# Patient Record
Sex: Male | Born: 1965 | Race: White | Hispanic: No | Smoking: Never smoker
Health system: Southern US, Academic
[De-identification: ages and names within clinical notes are randomized; demographics above are authoritative.]

## PROBLEM LIST (undated history)

## (undated) DIAGNOSIS — M25562 Pain in left knee: Secondary | ICD-10-CM

## (undated) DIAGNOSIS — Z86718 Personal history of other venous thrombosis and embolism: Secondary | ICD-10-CM

## (undated) DIAGNOSIS — E782 Mixed hyperlipidemia: Secondary | ICD-10-CM

## (undated) HISTORY — PX: KNEE ARTHROSCOPY: SUR90

## (undated) HISTORY — DX: Pain in left knee: M25.562

## (undated) HISTORY — DX: Mixed hyperlipidemia: E78.2

## (undated) HISTORY — DX: Personal history of other venous thrombosis and embolism: Z86.718

---

## 2010-12-17 ENCOUNTER — Other Ambulatory Visit (HOSPITAL_COMMUNITY): Payer: Self-pay | Admitting: Psychiatry

## 2021-12-06 ENCOUNTER — Other Ambulatory Visit (HOSPITAL_PSYCHIATRIC): Payer: Self-pay | Admitting: Psychiatry

## 2021-12-06 MED ORDER — METHYLPHENIDATE 10 MG TABLET
10.0000 mg | ORAL_TABLET | Freq: Three times a day (TID) | ORAL | 0 refills | Status: DC
Start: 2021-12-06 — End: 2022-01-04

## 2022-01-04 ENCOUNTER — Other Ambulatory Visit (HOSPITAL_PSYCHIATRIC): Payer: Self-pay | Admitting: Psychiatry

## 2022-01-04 MED ORDER — METHYLPHENIDATE 10 MG TABLET
10.0000 mg | ORAL_TABLET | Freq: Three times a day (TID) | ORAL | 0 refills | Status: DC
Start: 2022-01-04 — End: 2022-02-03

## 2022-01-12 ENCOUNTER — Encounter (HOSPITAL_PSYCHIATRIC): Payer: Self-pay | Admitting: Psychiatry

## 2022-01-12 DIAGNOSIS — F411 Generalized anxiety disorder: Secondary | ICD-10-CM | POA: Insufficient documentation

## 2022-01-12 DIAGNOSIS — F332 Major depressive disorder, recurrent severe without psychotic features: Secondary | ICD-10-CM

## 2022-01-12 NOTE — Progress Notes (Signed)
Behavioral Health-AMB    Last seen 08/09/2021, no med changes made then. He came to see his brother for thanksgiving.  No plans for Christmas.  Wife is doing good.  She has no complaints.  He states he has days where he has a tough time but manages fairly well.  Some bothersome thoughts, no suicidal ideation. His cognitive issues are "doing better". Takes meds, no SE. no recent reports of problems.  He has come off of hydrocodone. He has not been sleeping as well and is working on sleep hygiene.  No suicidal ideation.  No psychosis.  He looks forward to football season. His wife is supportive but concerned.  He denies any psychosis.  He states that he has a lot of bothersome and worrisome thoughts which take to resolve.  It also affects his ability to remember information because he is fretful.  He has had no psychosis, no homicidal ideation, no substance use issues. He takes medications.  Sleep is fair.  Appetite good.    PHQ9 = 15    Board of Pharmacy:  looks appropriate    Diagnosis:  AXIS I:  MDD, recurrent, severe without psychosis  GAD    AXISII:  None    AXISIII:  L. Knee pain  Anorgasmia    Current Medications from provider:  Valium  5mg  po three times daily as needed.  He has rarely been taking Valium  Neurontin 400 mg tid  Prozac 60 mg qam  Wellbutrin SR 300 qam, 150 mg q afternoon  Viagra 100 mg qday prn  Ritalin 10mg  twice a day  Ambien 10 mg p.o. nightly.    Allergies: NKDA    Pharmacy: Gets 90 day supply of medications [facility], he gets Ritalin at [facility] pharmacy at times, [facility]    Assessment & Plan    (1) Major depressive disorder, recurrent severe without psychotic features:        Status: Chronic        Plan:  Moderate level of medical decision making includes review of old records, discussion of multiple diagnosis and symptoms, review of PHQ-9, review of symptoms, patient education, discussion of prescribed medications and potential side effects,  discussion of psychosocial stressors, and review of prescription monitoring program information.  I offered support and encouragement.     From a psychiatric standpoint he is doing fairly well.  He is found a good routine that helps him maintain stability and whether depressive episodes.  Offered support.  No changes in medications.  I commended his efforts to come off of opiate medications. He will call with any problems and has crisis number should he require them.I offered support and encouraged him to call if he had any problems.  We have discussed medications and warnings associated with stimulants and benzodiazepines many times in the past.  He will call with any problems.          Code(s):  F33.2 - Major depressive disorder, recurrent severe without psychotic features  (2) Generalized anxiety disorder:        Status: Chronic        Code(s):  F41.1 - Generalized anxiety disorder

## 2022-02-03 ENCOUNTER — Other Ambulatory Visit: Payer: Self-pay

## 2022-02-03 ENCOUNTER — Ambulatory Visit: Payer: Medicare Other | Attending: Psychiatry | Admitting: Psychiatry

## 2022-02-03 ENCOUNTER — Encounter (HOSPITAL_PSYCHIATRIC): Payer: Self-pay | Admitting: Psychiatry

## 2022-02-03 VITALS — BP 147/86 | HR 86 | Resp 18 | Ht 71.0 in | Wt 253.0 lb

## 2022-02-03 DIAGNOSIS — F332 Major depressive disorder, recurrent severe without psychotic features: Secondary | ICD-10-CM | POA: Insufficient documentation

## 2022-02-03 DIAGNOSIS — F411 Generalized anxiety disorder: Secondary | ICD-10-CM | POA: Insufficient documentation

## 2022-02-03 MED ORDER — GABAPENTIN 400 MG CAPSULE
400.0000 mg | ORAL_CAPSULE | Freq: Three times a day (TID) | ORAL | 1 refills | Status: DC
Start: 2022-02-03 — End: 2022-02-25

## 2022-02-03 MED ORDER — METHYLPHENIDATE 10 MG TABLET
10.0000 mg | ORAL_TABLET | Freq: Three times a day (TID) | ORAL | 0 refills | Status: DC
Start: 2022-02-03 — End: 2022-02-05

## 2022-02-03 MED ORDER — VORTIOXETINE 10 MG TABLET
10.0000 mg | ORAL_TABLET | Freq: Every day | ORAL | 1 refills | Status: DC
Start: 2022-02-03 — End: 2022-04-26

## 2022-02-03 NOTE — Progress Notes (Signed)
[facility]  BEHAVIORAL MEDICINE, [facility] [place]  Operated by [facility]  Progress Note    Name: Preston Jones MRN:  [id number]   Date: 02/03/2022 Age: 56 y.o.       Chief Complaint:  Seen for follow-up of severe depression and anxiety    Subjective:   Last seen 08/09/2021, no med changes made then. He and the wife are up in [place] for the summer.  He still deals with social anxiety.  No plans for summer.  Wife is doing good. She has no complaints.  He states he has days where he has a tough time but manages fairly well.  Some bothersome thoughts, no suicidal ideation. His cognitive issues are "doing okay". Takes meds, no SE. no recent reports of problems.  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  He has come off of hydrocodone. He has not been sleeping as well and is working on sleep hygiene.  No suicidal ideation.  No psychosis. He takes medications.  Sleep is fair.  Appetite good.  He states that he tried to stop Prozac but had to go back on the medication because worsening depressive symptoms.  He continues to have issues with it would like to change to a different medication.    PHQ9 = 11    Board of Pharmacy:  looks appropriate    Diagnosis:  AXIS I:  MDD, recurrent, severe without psychosis  GAD    AXISII:  None    AXISIII:  L. Knee pain  Anorgasmia    Current Medications from provider:  Valium  5mg  po three times daily as needed.  He has rarely been taking Valium  Neurontin 400 mg tid  Prozac 60 mg qam  Wellbutrin SR 300 qam, 150 mg q afternoon  Viagra 100 mg qday prn  Ritalin 10mg  twice a day  Ambien 10 mg p.o. nightly.    Allergies: NKDA    Pharmacy: Gets 90 day supply of medications [facility], he gets Ritalin at [facility] pharmacy at times, [facility]    Review of Systems:     All systems reviewed & are unremarkable except as noted in HPI and below  Constitutional:  alert and oriented x4  and appears well  HENT: normal HENT inspection and hearing grossly normal bilaterally  Respiratory: normal respiratory effort, No respiratory distress   Cardiovascular:  No cardiac complaints, no chest pain  Gastrointestinal:  No GI complaints  Musculoskeletal:  No complaints  Skin:  Warm,  dry, no rashes  Neurological:  No focal neurological deficits  Objective :  The patient is alert and oriented x4, casually dressed, good eye contact, well groomed, appearing stated age.  Speech is normal rate and tone.  Patient is talkative and personable. There is no flight of ideas, loosening of associations, or tangential speech.  Not manic.  Mood is euthymic with no complaints.  Affect congruent.  Patient does not appear to be in any acute physical distress.  No suicidal or homicidal ideation.  No auditory or visual hallucinations, no delusions, no paranoia.  No signs of psychosis.  No plans to harm self or others.  Patient is not aggressive or threatening.  No psychomotor agitation.  No psychomotor retardation.  No abnormal involuntary movements. Thoughts are linear, logical, and goal directed.  Intellectual functioning is good.  Memory is intact to recent, remote, and past events.  Patient can recall 3 of  3 objects at 0 and 5 minutes, and what was eaten for last meal.  Patient able to provide details of current situation.  Patient can name the president, vice president, and governor.  Language is good.  Vocabulary is unimpaired, no word finding difficulty or word misuse.  Intelligence is good, patient can interpret a proverb, and reports apple and orange similarity.  Calculation is unimpaired.  Concentration is good, able to recite days of week forward and backward.  Insight is good; patient is aware of their illness, how it affects their functioning, and what needs to happen for future improvement.  Judgment is good; patient is compliant with treatment and can relate appropriately to what they would do if smelling smoke in  a theater, or finding stamped addressed envelope.      Data reviewed:  PHQ-9, board of pharmacy profile, old records    Current Outpatient Medications   Medication Sig   . buPROPion (WELLBUTRIN SR) 150 mg Oral tablet sustained-release 12 hr Take 1 Tablet (150 mg total) by mouth Twice daily Take 300 mg po qam,  po q2pm.   . Cholecalciferol, Vitamin D3, 25 mcg (1,000 unit) Oral Capsule Take 1 Capsule (1,000 Units total) by mouth Once a day   . diazePAM (VALIUM) 5 mg Oral Tablet Take 1 Tablet (5 mg total) by mouth Three times a day as needed for Anxiety   . diclofenac sodium (ARTHRITIS PAIN, DICLOFENAC,) 1 % Gel Apply 2 g topically Four times a day   . fenofibrate (LOFIBRA) 160 mg Oral Tablet Take 1 Tablet (160 mg total) by mouth Once a day   . FLUoxetine (PROZAC) 20 mg Oral Capsule Take 3 Capsules (60 mg total) by mouth Every morning   . gabapentin (NEURONTIN) 400 mg Oral Capsule Take 1 Capsule (400 mg total) by mouth Three times a day   . hydrocortisone 2.5 % Apply externally cream with perineal applicator Insert 1 Applicator into the rectum Twice per day as needed   . Ibuprofen (MOTRIN) 800 mg Oral Tablet Take 1 Tablet (800 mg total) by mouth Three times a day   . methylphenidate HCl (RITALIN) 10 mg Oral Tablet Take 1 Tablet (10 mg total) by mouth Three times a day   . pravastatin (PRAVACHOL) 40 mg Oral Tablet Take 1 Tablet (40 mg total) by mouth Once a day   . zolpidem (AMBIEN) 10 mg Oral Tablet Take 1 Tablet (10 mg total) by mouth Every night as needed for Insomnia     Assessment/Plan  Problem List Items Addressed This Visit        Psychiatric    Major depressive disorder, recurrent, severe without psychotic features (CMS HCC) - Primary    Generalized anxiety disorder     Plan:  Moderate level of medical decision making includes review of old records, discussion of multiple diagnosis and symptoms, review of PHQ-9, review of symptoms, patient education, discussion of prescribed medications and potential side  effects, discussion of psychosocial stressors, and review of prescription monitoring program information.  I offered support and encouragement.     From a psychiatric standpoint he is doing fairly well.  The patient has side effects from Prozac are intolerable.  We will taper and stop that medication over time.  I will start him on Trintellix as well.  We will do a crossover titration that I gave him a list of on paper.  Continue other medications.  He will get refills today at a local pharmacy for 30 day supply with  2 refills on Neurontin, Trintellix, and a 1 month supply of Ritalin.    we discussed side effects specifically nausea, I Offered support.  No changes in other medications.  He will call with any problems and has crisis number should he require them.I offered support and encouraged him to call if he had any problems.  We have discussed medications and warnings associated with stimulants and benzodiazepines many times in the past.  He will call with any problems.      Vickki Hearing, MD

## 2022-02-05 ENCOUNTER — Other Ambulatory Visit (HOSPITAL_PSYCHIATRIC): Payer: Self-pay | Admitting: Psychiatry

## 2022-02-05 MED ORDER — METHYLPHENIDATE 10 MG TABLET
10.0000 mg | ORAL_TABLET | Freq: Three times a day (TID) | ORAL | 0 refills | Status: DC
Start: 2022-02-05 — End: 2022-02-25

## 2022-02-06 ENCOUNTER — Other Ambulatory Visit (HOSPITAL_PSYCHIATRIC): Payer: Self-pay | Admitting: Psychiatry

## 2022-02-17 ENCOUNTER — Other Ambulatory Visit (HOSPITAL_PSYCHIATRIC): Payer: Self-pay | Admitting: Psychiatry

## 2022-02-25 ENCOUNTER — Other Ambulatory Visit (HOSPITAL_PSYCHIATRIC): Payer: Self-pay | Admitting: Family

## 2022-02-25 MED ORDER — METHYLPHENIDATE 10 MG TABLET
10.0000 mg | ORAL_TABLET | Freq: Three times a day (TID) | ORAL | 0 refills | Status: DC
Start: 2022-03-07 — End: 2022-03-09

## 2022-02-25 MED ORDER — GABAPENTIN 400 MG CAPSULE
400.0000 mg | ORAL_CAPSULE | Freq: Three times a day (TID) | ORAL | 1 refills | Status: DC
Start: 2022-02-25 — End: 2022-04-26

## 2022-02-25 NOTE — Telephone Encounter (Signed)
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 He may be asking for a few days early because he gets it from mail order. Thanks!

## 2022-02-25 NOTE — Telephone Encounter (Signed)
I changed the date on the prescription of Ritalin. Thanks!

## 2022-02-25 NOTE — Telephone Encounter (Signed)
RX approved and encounter closed, PDMP reviewed thus fill date for Ritalin as stated. This is mail order so will allow time for delivery.

## 2022-03-09 ENCOUNTER — Other Ambulatory Visit (HOSPITAL_PSYCHIATRIC): Payer: Self-pay | Admitting: Psychiatry

## 2022-03-09 ENCOUNTER — Telehealth (HOSPITAL_PSYCHIATRIC): Payer: Self-pay | Admitting: Psychiatry

## 2022-03-10 MED ORDER — METHYLPHENIDATE 10 MG TABLET
10.0000 mg | ORAL_TABLET | Freq: Three times a day (TID) | ORAL | 0 refills | Status: DC
Start: 2022-03-10 — End: 2022-04-04

## 2022-03-22 ENCOUNTER — Other Ambulatory Visit (HOSPITAL_PSYCHIATRIC): Payer: Self-pay | Admitting: Psychiatry

## 2022-03-24 ENCOUNTER — Ambulatory Visit (HOSPITAL_PSYCHIATRIC): Payer: BC Managed Care – PPO | Admitting: Psychiatry

## 2022-03-24 NOTE — Progress Notes (Deleted)
[facility]  BEHAVIORAL MEDICINE, [facility] [place]  Operated by [facility]  Progress Note    Name: Preston Jones MRN:  [id number]   Date: 03/24/2022 Age: 56 y.o.       Chief Complaint:  Seen for follow-up of severe depression and anxiety    Subjective:   Last seen 02/03/2022,no med changes made then. He and the wife are up in [place] for the summer.  He still deals with social anxiety. No plans for summer. Wife is doing good. She has no complaints. He states he has days where he has a tough time but manages fairly well. Some bothersome thoughts, no suicidal ideation. His cognitive issues are "doing okay". Takes meds, no SE. no recent reports of problems. 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  He has come off of hydrocodone. He has not been sleeping as well and is working on sleep hygiene. No suicidal ideation. No psychosis. He takes medications. Sleep is fair. Appetite good.  He states that he tried to stop Prozac but had to go back on the medication because worsening depressive symptoms.  He continues to have issues with it would like to change to a different medication.    PHQ9 = 11    Board of Pharmacy:  looks appropriate    Diagnosis:  AXIS I:  MDD, recurrent, severe without psychosis  GAD    AXISII:  None    AXISIII:  L. Knee pain  Anorgasmia    Current Medications from provider:  Valium 5mg  po three times daily as needed. He has rarely been taking Valium  Neurontin 400 mg tid  Prozac 60 mg qam  Wellbutrin SR 300 qam, 150 mg q afternoon  Viagra 100 mg qday prn  Ritalin 10mg  twice a day  Ambien 10 mg p.o. nightly.    Allergies: NKDA    Pharmacy: Gets [facility] day supply of medications [facility], he gets Ritalin at [facility] pharmacy at times, [facility]    Review of Systems:           All systems reviewed & are unremarkable except as noted in HPI and below  Constitutional:  alert and  oriented x4 and appears well  HENT: normal HENT inspection and hearing grossly normal bilaterally  Respiratory: normal respiratory effort, No respiratory distress   Cardiovascular:  No cardiac complaints, no chest pain  Gastrointestinal:  No GI complaints  Musculoskeletal:  No complaints  Skin:  Warm,  dry, no rashes  Neurological:  No focal neurological deficits  Objective :  The patient is alert and oriented x4, casually dressed, good eye contact, well groomed, appearing stated age.  Speech is normal rate and tone.  Patient is talkative and personable. There is no flight of ideas, loosening of associations, or tangential speech.  Not manic.  Mood is euthymic with no complaints.  Affect congruent.  Patient does not appear to be in any acute physical distress.  No suicidal or homicidal ideation.  No auditory or visual hallucinations, no delusions, no paranoia.  No signs of psychosis.  No plans to harm self or others.  Patient is not aggressive or threatening.  No psychomotor agitation.  No psychomotor retardation.  No abnormal involuntary movements. Thoughts are linear, logical, and goal directed.  Intellectual functioning is good.  Memory is intact to recent, remote, and past events.  Patient can recall 3 of 3 objects at 0 and 5  minutes, and what was eaten for last meal.  Patient able to provide details of current situation.  Patient can name the president, vice president, and governor.  Language is good.  Vocabulary is unimpaired, no word finding difficulty or word misuse.  Intelligence is good, patient can interpret a proverb, and reports apple and orange similarity.  Calculation is unimpaired.  Concentration is good, able to recite days of week forward and backward.  Insight is good; patient is aware of their illness, how it affects their functioning, and what needs to happen for future improvement.  Judgment is good; patient is compliant with treatment and can relate appropriately to what they would do if  smelling smoke in a theater, or finding stamped addressed envelope.      Data reviewed:  PHQ-9, board of pharmacy profile, old records    Current Outpatient Medications   Medication Sig   . buPROPion (WELLBUTRIN SR) 150 mg Oral tablet sustained-release 12 hr Take 1 Tablet (150 mg total) by mouth Twice daily Take 300 mg po qam, 150mg  po q2pm.   . Cholecalciferol, Vitamin D3, 25 mcg (1,000 unit) Oral Capsule Take 1 Capsule (1,000 Units total) by mouth Once a day   . diazePAM (VALIUM) 5 mg Oral Tablet Take 1 Tablet (5 mg total) by mouth Three times a day as needed for Anxiety   . diclofenac sodium (VOLTAREN) 1 % Gel Apply 2 g topically Four times a day   . fenofibrate (LOFIBRA) 160 mg Oral Tablet Take 1 Tablet (160 mg total) by mouth Once a day   . FLUoxetine (PROZAC) 20 mg Oral Capsule TAKE 3 CAPSULES (60 MG) EVERY MORNING   . gabapentin (NEURONTIN) 400 mg Oral Capsule Take 1 Capsule (400 mg total) by mouth Three times a day   . hydrocortisone 2.5 % Apply externally cream with perineal applicator Insert 1 Applicator into the rectum Twice per day as needed   . Ibuprofen (MOTRIN) 800 mg Oral Tablet Take 1 Tablet (800 mg total) by mouth Three times a day   . methylphenidate HCl (RITALIN) 10 mg Oral Tablet Take 1 Tablet (10 mg total) by mouth Three times a day   . pravastatin (PRAVACHOL) 40 mg Oral Tablet Take 1 Tablet (40 mg total) by mouth Once a day   . vortioxetine (TRINTELLIX) 10 mg Oral Tablet Take 1 Tablet (10 mg total) by mouth Once a day   . zolpidem (AMBIEN) 10 mg Oral Tablet Take 1 Tablet (10 mg total) by mouth Every night as needed for Insomnia     Assessment/Plan  Problem List Items Addressed This Visit        Psychiatric    Major depressive disorder, recurrent, severe without psychotic features (CMS HCC)    Generalized anxiety disorder - Primary     Plan:  Moderate level of medical decision making includes review of old records, discussion of multiple diagnosis and symptoms, review of PHQ-9, review of  symptoms, patient education, discussion of prescribed medications and potential side effects, discussion of psychosocial stressors, and review of prescription monitoring program information. I offered support and encouragement.     From a psychiatric standpoint he is doing fairly well.  The patient has side effects from Prozac are intolerable.  We will taper and stop that medication over time.  I will start him on Trintellix as well.  We will do a crossover titration that I gave him a list of on paper.  Continue other medications.  He will get refills today at  a local pharmacy for 30 day supply with 2 refills on Neurontin, Trintellix, and a 1 month supply of Ritalin.   we discussed side effects specifically nausea, I Offered support. No changes in other medications. He will call with any problems and has crisis number should he require them.I offered support and encouraged him to call if he had any problems. We have discussed medications and warnings associated with stimulants and benzodiazepines many times in the past. He will call with any problems.     Vickki Hearing, MD

## 2022-04-04 ENCOUNTER — Other Ambulatory Visit (HOSPITAL_PSYCHIATRIC): Payer: Self-pay | Admitting: Family

## 2022-04-04 MED ORDER — METHYLPHENIDATE 10 MG TABLET
10.0000 mg | ORAL_TABLET | Freq: Three times a day (TID) | ORAL | 0 refills | Status: DC
Start: 2022-04-04 — End: 2022-05-02

## 2022-04-04 NOTE — Telephone Encounter (Signed)
RX approved and encounter closed

## 2022-04-04 NOTE — Telephone Encounter (Signed)
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 Do you mind to [facility] [place]? Thanks!

## 2022-04-05 ENCOUNTER — Other Ambulatory Visit (HOSPITAL_PSYCHIATRIC): Payer: Self-pay | Admitting: Psychiatry

## 2022-04-26 ENCOUNTER — Ambulatory Visit: Payer: Medicare Other | Attending: Psychiatry | Admitting: Psychiatry

## 2022-04-26 ENCOUNTER — Other Ambulatory Visit: Payer: Self-pay

## 2022-04-26 ENCOUNTER — Encounter (HOSPITAL_PSYCHIATRIC): Payer: Self-pay | Admitting: Psychiatry

## 2022-04-26 VITALS — BP 156/84 | HR 76 | Resp 18 | Ht 71.0 in | Wt 250.0 lb

## 2022-04-26 DIAGNOSIS — F411 Generalized anxiety disorder: Secondary | ICD-10-CM | POA: Insufficient documentation

## 2022-04-26 DIAGNOSIS — F332 Major depressive disorder, recurrent severe without psychotic features: Secondary | ICD-10-CM | POA: Insufficient documentation

## 2022-04-26 MED ORDER — DIAZEPAM 5 MG TABLET
5.0000 mg | ORAL_TABLET | Freq: Three times a day (TID) | ORAL | 1 refills | Status: DC | PRN
Start: 2022-04-26 — End: 2022-07-04

## 2022-04-26 MED ORDER — ESCITALOPRAM 10 MG TABLET
10.0000 mg | ORAL_TABLET | Freq: Every day | ORAL | 1 refills | Status: DC
Start: 2022-04-26 — End: 2022-04-28

## 2022-04-26 MED ORDER — ZOLPIDEM 10 MG TABLET
10.0000 mg | ORAL_TABLET | Freq: Every evening | ORAL | 1 refills | Status: DC | PRN
Start: 2022-04-26 — End: 2022-07-04

## 2022-04-26 MED ORDER — GABAPENTIN 400 MG CAPSULE
400.0000 mg | ORAL_CAPSULE | Freq: Three times a day (TID) | ORAL | 1 refills | Status: DC
Start: 2022-04-26 — End: 2022-05-05

## 2022-04-26 MED ORDER — BUPROPION HCL SR 150 MG TABLET,12 HR SUSTAINED-RELEASE
ORAL_TABLET | ORAL | 1 refills | Status: DC
Start: 2022-04-26 — End: 2022-06-20

## 2022-04-26 NOTE — Progress Notes (Signed)
[facility]  BEHAVIORAL MEDICINE, [facility] [place]  Operated by [facility]  Progress Note    Name: Preston Jones MRN:  [id number]   Date: 04/26/2022 Age: 56 y.o.       Chief Complaint:  Seen for follow-up of severe depression and anxiety     Subjective:   Last seen 02/03/2022, no med changes made then. He and the wife are up in [place] for the summer.  He still deals with social anxiety. He tries to avoid people if he can.  No plans for rest of summer.  Trintellix was not covered by insurance.  Wife worries about him.  Some bothersome thoughts, no suicidal ideation. His cognitive issues are "doing okay". Takes meds, no SE. no recent reports of problems.  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  He has come off of hydrocodone. He has not been sleeping as well and is working on sleep hygiene.  No suicidal ideation.  No psychosis. He takes medications.  Sleep is fair.  Appetite good.  He states that he tried to stop Prozac but had to go back on the medication because worsening depressive symptoms.  He continues to have issues with it would like to change to a different medication.     PHQ9 = 18     Board of Pharmacy:  looks appropriate     Diagnosis:  AXIS I:  MDD, recurrent, severe without psychosis  GAD     AXISII:  None     AXISIII:  L. Knee pain  Anorgasmia     Current Medications from provider:  Valium  5mg  po three times daily as needed.  He has rarely been taking Valium  Neurontin 400 mg tid  Prozac 60 mg qam  Wellbutrin SR 300 qam, 150 mg q afternoon  Viagra 100 mg qday prn  Ritalin 10mg  twice a day  Ambien 10 mg p.o. nightly.     Allergies: NKDA     Pharmacy: [facility], [facility], [facility]     Review of Systems:           All systems reviewed & are unremarkable except as noted in HPI and below  Constitutional:  alert and oriented x4  and appears well  HENT: normal HENT inspection and hearing grossly normal bilaterally  Respiratory: normal respiratory effort, No respiratory distress   Cardiovascular:  No cardiac complaints, no chest pain  Gastrointestinal:  No GI complaints  Musculoskeletal:  No complaints  Skin:  Warm,  dry, no rashes  Neurological:  No focal neurological deficits  Objective :  The patient is alert and oriented x4, casually dressed, good eye contact, well groomed, appearing stated age.  Speech is normal rate and tone.  Patient is talkative and personable. There is no flight of ideas, loosening of associations, or tangential speech.  Not manic.  Mood is euthymic with no complaints.  Affect congruent.  Patient does not appear to be in any acute physical distress.  No suicidal or homicidal ideation.  No auditory or visual hallucinations, no delusions, no paranoia.  No signs of psychosis.  No plans to harm self or others.  Patient is not aggressive or threatening.  No psychomotor agitation.  No psychomotor retardation.  No abnormal involuntary movements. Thoughts are linear, logical, and goal directed.  Intellectual functioning is good.  Memory is  intact to recent, remote, and past events.  Patient can recall 3 of 3 objects at 0 and 5 minutes, and what was eaten for last meal.  Patient able to provide details of current situation.  Patient can name the president, vice president, and governor.  Language is good.  Vocabulary is unimpaired, no word finding difficulty or word misuse.  Intelligence is good, patient can interpret a proverb, and reports apple and orange similarity.  Calculation is unimpaired.  Concentration is good, able to recite days of week forward and backward.  Insight is good; patient is aware of their illness, how it affects their functioning, and what needs to happen for future improvement.  Judgment is good; patient is compliant with treatment and can relate appropriately to what they would do if smelling smoke in  a theater, or finding stamped addressed envelope.       Data reviewed:  PHQ-9, board of pharmacy profile, old records    Current Outpatient Medications   Medication Sig    buPROPion (WELLBUTRIN SR) 150 mg Oral tablet sustained-release 12 hr TAKE 2 TABLETS EVERY MORNING AND 1 TABLET EVERY 2 P.M. AS DIRECTED    Cholecalciferol, Vitamin D3, 25 mcg (1,000 unit) Oral Capsule Take 1 Capsule (1,000 Units total) by mouth Once a day    diazePAM (VALIUM) 5 mg Oral Tablet Take 1 Tablet (5 mg total) by mouth Three times a day as needed for Anxiety    diclofenac sodium (VOLTAREN) 1 % Gel Apply 2 g topically Four times a day    fenofibrate (LOFIBRA) 160 mg Oral Tablet Take 1 Tablet (160 mg total) by mouth Once a day    FLUoxetine (PROZAC) 20 mg Oral Capsule TAKE 3 CAPSULES (60 MG) EVERY MORNING    gabapentin (NEURONTIN) 400 mg Oral Capsule Take 1 Capsule (400 mg total) by mouth Three times a day    hydrocortisone 2.5 % Apply externally cream with perineal applicator Insert 1 Applicator into the rectum Twice per day as needed    Ibuprofen (MOTRIN) 800 mg Oral Tablet Take 1 Tablet (800 mg total) by mouth Three times a day    methylphenidate HCl (RITALIN) 10 mg Oral Tablet Take 1 Tablet (10 mg total) by mouth Three times a day    pravastatin (PRAVACHOL) 40 mg Oral Tablet Take 1 Tablet (40 mg total) by mouth Once a day    vortioxetine (TRINTELLIX) 10 mg Oral Tablet Take 1 Tablet (10 mg total) by mouth Once a day    zolpidem (AMBIEN) 10 mg Oral Tablet Take 1 Tablet (10 mg total) by mouth Every night as needed for Insomnia     Assessment/Plan  Problem List Items Addressed This Visit          Psychiatric    Major depressive disorder, recurrent, severe without psychotic features (CMS HCC) - Primary     Plan:  Moderate level of medical decision making includes review of old records, discussion of multiple diagnosis and symptoms, review of PHQ-9, review of symptoms, patient education, discussion of prescribed medications and potential side  effects, discussion of psychosocial stressors, and review of prescription monitoring program information.  I offered support and encouragement.      The patient continues to struggle with some depressive symptoms.  We will stop Prozac and replace it with Lexapro 10 mg p.o. q.a.m.Marland Kitchen  Continue other medications.  I think he has some apathy and anhedonia related to long-term Prozac administration.   I Offered support.  No changes in other medications.  He will call with any problems and has crisis number should he require them.I offered support and encouraged him to call if he had any problems.  We have discussed medications and warnings associated with stimulants and benzodiazepines many times in the past.  He will call with any problems.      Vickki Hearing, MD

## 2022-04-28 ENCOUNTER — Other Ambulatory Visit (HOSPITAL_PSYCHIATRIC): Payer: Self-pay | Admitting: Psychiatry

## 2022-04-28 MED ORDER — ESCITALOPRAM 10 MG TABLET
10.0000 mg | ORAL_TABLET | Freq: Every day | ORAL | 0 refills | Status: DC
Start: 2022-04-28 — End: 2022-07-04

## 2022-05-02 ENCOUNTER — Other Ambulatory Visit (HOSPITAL_PSYCHIATRIC): Payer: Self-pay | Admitting: Psychiatry

## 2022-05-03 MED ORDER — METHYLPHENIDATE 10 MG TABLET
10.0000 mg | ORAL_TABLET | Freq: Three times a day (TID) | ORAL | 0 refills | Status: DC
Start: 2022-05-03 — End: 2022-05-31

## 2022-05-05 ENCOUNTER — Other Ambulatory Visit (HOSPITAL_PSYCHIATRIC): Payer: Self-pay | Admitting: Psychiatry

## 2022-05-05 MED ORDER — GABAPENTIN 400 MG CAPSULE
400.0000 mg | ORAL_CAPSULE | Freq: Three times a day (TID) | ORAL | 1 refills | Status: DC
Start: 2022-05-05 — End: 2022-07-04

## 2022-05-05 NOTE — Telephone Encounter (Signed)
Patient called and wants his future refills of Gabapentin sent [facility] for [facility] day supply. I have built the prescription if you don't mind to send it in. [facility] [place]. Thanks!

## 2022-05-31 ENCOUNTER — Telehealth (HOSPITAL_PSYCHIATRIC): Payer: Self-pay | Admitting: Psychiatry

## 2022-05-31 ENCOUNTER — Other Ambulatory Visit (HOSPITAL_PSYCHIATRIC): Payer: Self-pay | Admitting: Psychiatry

## 2022-05-31 MED ORDER — METHYLPHENIDATE 10 MG TABLET
10.0000 mg | ORAL_TABLET | Freq: Three times a day (TID) | ORAL | 0 refills | Status: DC
Start: 2022-05-31 — End: 2022-06-29

## 2022-05-31 NOTE — Telephone Encounter (Signed)
[facility]ief [facility] [place]. Please call him back and let him know you got it called in.  [phone]

## 2022-06-19 ENCOUNTER — Other Ambulatory Visit (HOSPITAL_PSYCHIATRIC): Payer: Self-pay | Admitting: Psychiatry

## 2022-06-20 ENCOUNTER — Ambulatory Visit (HOSPITAL_PSYCHIATRIC): Payer: Self-pay | Admitting: Psychiatry

## 2022-06-29 ENCOUNTER — Other Ambulatory Visit (HOSPITAL_PSYCHIATRIC): Payer: Self-pay | Admitting: Psychiatry

## 2022-06-29 MED ORDER — METHYLPHENIDATE 10 MG TABLET
10.0000 mg | ORAL_TABLET | Freq: Three times a day (TID) | ORAL | 0 refills | Status: DC
Start: 2022-06-29 — End: 2022-07-04

## 2022-06-30 ENCOUNTER — Ambulatory Visit (HOSPITAL_PSYCHIATRIC): Payer: Medicare Other | Admitting: Psychiatry

## 2022-07-04 ENCOUNTER — Ambulatory Visit: Payer: Medicare Other | Attending: Psychiatry | Admitting: Psychiatry

## 2022-07-04 ENCOUNTER — Other Ambulatory Visit: Payer: Self-pay

## 2022-07-04 ENCOUNTER — Encounter (HOSPITAL_PSYCHIATRIC): Payer: Self-pay | Admitting: Psychiatry

## 2022-07-04 VITALS — BP 139/69 | HR 79 | Resp 18 | Ht 71.0 in | Wt 250.0 lb

## 2022-07-04 DIAGNOSIS — F332 Major depressive disorder, recurrent severe without psychotic features: Secondary | ICD-10-CM | POA: Insufficient documentation

## 2022-07-04 DIAGNOSIS — F411 Generalized anxiety disorder: Secondary | ICD-10-CM

## 2022-07-04 MED ORDER — BUPROPION HCL SR 150 MG TABLET,12 HR SUSTAINED-RELEASE
ORAL_TABLET | ORAL | 1 refills | Status: DC
Start: 2022-07-04 — End: 2023-01-25

## 2022-07-04 MED ORDER — GABAPENTIN 400 MG CAPSULE
400.0000 mg | ORAL_CAPSULE | Freq: Three times a day (TID) | ORAL | 1 refills | Status: DC
Start: 2022-07-04 — End: 2022-08-03

## 2022-07-04 MED ORDER — ZOLPIDEM 10 MG TABLET
10.0000 mg | ORAL_TABLET | Freq: Every evening | ORAL | 1 refills | Status: DC | PRN
Start: 2022-07-04 — End: 2022-08-03

## 2022-07-04 MED ORDER — DIAZEPAM 5 MG TABLET
5.0000 mg | ORAL_TABLET | Freq: Three times a day (TID) | ORAL | 1 refills | Status: DC | PRN
Start: 2022-07-04 — End: 2022-08-03

## 2022-07-04 MED ORDER — METHYLPHENIDATE 10 MG TABLET
10.0000 mg | ORAL_TABLET | Freq: Three times a day (TID) | ORAL | 0 refills | Status: DC
Start: 2022-07-04 — End: 2022-08-03

## 2022-07-04 MED ORDER — FLUOXETINE 20 MG CAPSULE
60.0000 mg | ORAL_CAPSULE | Freq: Every day | ORAL | 1 refills | Status: DC
Start: 2022-07-04 — End: 2022-08-03

## 2022-07-04 NOTE — Progress Notes (Signed)
[facility]  BEHAVIORAL MEDICINE, [facility] [place]  Operated by [facility]  Progress Note    Name: Preston Jones MRN:  [id number]   Date: 07/04/2022 Age: 56 y.o.       Chief Complaint:  Seen for follow-up of severe depression and anxiety     Subjective:   Last seen 04/26/2022, no med changes made then. He is doing fairly well.  He is had trouble finding a pharmacy that carries Ritalin.  He still deals with social anxiety. He tries to avoid people if he can.  No plans for fall.  He was unable to tolerate Lexapro and went back to Prozac with good results.  He had significant drop mouth.  Wife worries about him.  Some bothersome thoughts, no suicidal ideation. His cognitive issues are "doing okay". Takes meds, no SE. no recent reports of problems.  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  He has come off of hydrocodone. He has not been sleeping as well and is working on sleep hygiene.  No suicidal ideation.  No psychosis. He takes medications.  Sleep is fair.  Appetite good.  He states that he tried to stop Prozac but had to go back on the medication because worsening depressive symptoms.  He continues to have issues with it would like to change to a different medication.     PHQ9 = 18     Board of Pharmacy:  looks appropriate     Diagnosis:  AXIS I:  MDD, recurrent, severe without psychosis  GAD     AXISII:  None     AXISIII:  L. Knee pain  Anorgasmia     Current Medications from provider:  Valium  5mg  po three times daily as needed.  He has rarely been taking Valium  Neurontin 400 mg tid  Prozac 60 mg qam  Wellbutrin SR 300 qam, 150 mg q afternoon  Viagra 100 mg qday prn  Ritalin 10mg  twice a day  Ambien 10 mg p.o. nightly.     Allergies: NKDA     Pharmacy: [facility], he gets [facility]italin at [facility] pharmacy at times, [facility]     Review of Systems:           All systems  reviewed & are unremarkable except as noted in HPI and below  Constitutional:  alert and oriented x4 and appears well  HENT: normal HENT inspection and hearing grossly normal bilaterally  Respiratory: normal respiratory effort, No respiratory distress   Cardiovascular:  No cardiac complaints, no chest pain  Gastrointestinal:  No GI complaints  Musculoskeletal:  No complaints  Skin:  Warm,  dry, no rashes  Neurological:  No focal neurological deficits  Objective :  The patient is alert and oriented x4, casually dressed, good eye contact, well groomed, appearing stated age.  Speech is normal rate and tone.  Patient is talkative and personable. There is no flight of ideas, loosening of associations, or tangential speech.  Not manic.  Mood is euthymic with no complaints.  Affect congruent.  Patient does not appear to be in any acute physical distress.  No suicidal or homicidal ideation.  No auditory or visual hallucinations, no delusions, no paranoia.  No signs of psychosis.  No plans to harm self or others.  Patient is not aggressive or threatening.  No psychomotor agitation.  No psychomotor retardation.  No abnormal  involuntary movements. Thoughts are linear, logical, and goal directed.  Intellectual functioning is good.  Memory is intact to recent, remote, and past events.  Patient can recall 3 of 3 objects at 0 and 5 minutes, and what was eaten for last meal.  Patient able to provide details of current situation.  Patient can name the president, vice president, and governor.  Language is good.  Vocabulary is unimpaired, no word finding difficulty or word misuse.  Intelligence is good, patient can interpret a proverb, and reports apple and orange similarity.  Calculation is unimpaired.  Concentration is good, able to recite days of week forward and backward.  Insight is good; patient is aware of their illness, how it affects their functioning, and what needs to happen for future improvement.  Judgment is good;  patient is compliant with treatment and can relate appropriately to what they would do if smelling smoke in a theater, or finding stamped addressed envelope.       Data reviewed:  PHQ-9, board of pharmacy profile, old records    Current Outpatient Medications   Medication Sig    buPROPion (WELLBUTRIN SR) 150 mg Oral tablet sustained-release 12 hr TAKE 2 TABLETS EVERY MORNING AND 1 TABLET EVERY 2 P.M. AS DIRECTED    Cholecalciferol, Vitamin D3, 25 mcg (1,000 unit) Oral Capsule Take 1 Capsule (1,000 Units total) by mouth Once a day    diazePAM (VALIUM) 5 mg Oral Tablet Take 1 Tablet (5 mg total) by mouth Three times a day as needed for Anxiety    diclofenac sodium (VOLTAREN) 1 % Gel Apply 2 g topically Four times a day    escitalopram oxalate (LEXAPRO) 10 mg Oral Tablet Take 1 Tablet (10 mg total) by mouth Once a day    fenofibrate (LOFIBRA) 160 mg Oral Tablet Take 1 Tablet (160 mg total) by mouth Once a day    gabapentin (NEURONTIN) 400 mg Oral Capsule Take 1 Capsule (400 mg total) by mouth Three times a day    hydrocortisone 2.5 % Apply externally cream with perineal applicator Insert 1 Applicator into the rectum Twice per day as needed    Ibuprofen (MOTRIN) 800 mg Oral Tablet Take 1 Tablet (800 mg total) by mouth Three times a day    methylphenidate HCl (RITALIN) 10 mg Oral Tablet Take 1 Tablet (10 mg total) by mouth Three times a day    pravastatin (PRAVACHOL) 40 mg Oral Tablet Take 1 Tablet (40 mg total) by mouth Once a day    zolpidem (AMBIEN) 10 mg Oral Tablet Take 1 Tablet (10 mg total) by mouth Every night as needed for Insomnia     Assessment/Plan  Problem List Items Addressed This Visit          Psychiatric    Major depressive disorder, recurrent, severe without psychotic features (CMS HCC) - Primary    Generalized anxiety disorder     Plan:  Moderate level of medical decision making includes review of old records, discussion of multiple diagnosis and symptoms, review of PHQ-9, review of symptoms, patient  education, discussion of prescribed medications and potential side effects, discussion of psychosocial stressors, and review of prescription monitoring program information.  I offered support and encouragement.      Mr. Georgina Snell has complicated depression and anxiety which has historically has required polypharmacy and unorthodox doses of medications.  His complicated medicine regimen is effective and has developed over many years with numerous failures of medications.  I sent in refills of medications.  I Offered support.   He will call with any problems and has crisis number should he require them.I offered support and encouraged him to call if he had any problems.  We have discussed medications and warnings associated with stimulants and benzodiazepines many times in the past.  He will call with any problems.      Vickki Hearing, MD

## 2022-08-03 ENCOUNTER — Ambulatory Visit: Payer: Medicare Other | Attending: Family | Admitting: Family

## 2022-08-03 ENCOUNTER — Encounter (HOSPITAL_PSYCHIATRIC): Payer: Self-pay | Admitting: Family

## 2022-08-03 ENCOUNTER — Other Ambulatory Visit: Payer: Self-pay

## 2022-08-03 VITALS — BP 144/81 | HR 69 | Resp 18 | Ht 71.0 in | Wt 253.0 lb

## 2022-08-03 DIAGNOSIS — Z79899 Other long term (current) drug therapy: Secondary | ICD-10-CM | POA: Insufficient documentation

## 2022-08-03 DIAGNOSIS — F411 Generalized anxiety disorder: Secondary | ICD-10-CM | POA: Insufficient documentation

## 2022-08-03 DIAGNOSIS — F4010 Social phobia, unspecified: Secondary | ICD-10-CM | POA: Insufficient documentation

## 2022-08-03 DIAGNOSIS — G479 Sleep disorder, unspecified: Secondary | ICD-10-CM | POA: Insufficient documentation

## 2022-08-03 DIAGNOSIS — F331 Major depressive disorder, recurrent, moderate: Secondary | ICD-10-CM | POA: Insufficient documentation

## 2022-08-03 MED ORDER — GABAPENTIN 400 MG CAPSULE
400.0000 mg | ORAL_CAPSULE | Freq: Three times a day (TID) | ORAL | 1 refills | Status: DC
Start: 2022-08-03 — End: 2023-02-06

## 2022-08-03 MED ORDER — METHYLPHENIDATE 10 MG TABLET
10.0000 mg | ORAL_TABLET | Freq: Three times a day (TID) | ORAL | 0 refills | Status: DC
Start: 2022-08-03 — End: 2022-08-24

## 2022-08-03 MED ORDER — DIAZEPAM 5 MG TABLET
5.0000 mg | ORAL_TABLET | Freq: Three times a day (TID) | ORAL | 1 refills | Status: AC | PRN
Start: 2022-08-03 — End: ?

## 2022-08-03 MED ORDER — FLUOXETINE 20 MG CAPSULE
60.0000 mg | ORAL_CAPSULE | Freq: Every day | ORAL | 1 refills | Status: AC
Start: 2022-08-03 — End: ?

## 2022-08-03 MED ORDER — ZOLPIDEM 10 MG TABLET
10.0000 mg | ORAL_TABLET | Freq: Every evening | ORAL | 1 refills | Status: AC | PRN
Start: 2022-08-03 — End: ?

## 2022-08-03 NOTE — Progress Notes (Signed)
[facility]  BEHAVIORAL MEDICINE, [facility] [place]  Operated by [facility]  Progress Note    Name: Preston Jones MRN:  [id number]   Date: 08/03/2022 Age: 55 y.o.       Chief Complaint: Generalized Anxiety and Major Depression    Subjective:   Here for medication follow up. Was seeing Dr. Diona Browner who is no longer with practice. Takes valium 1-2 times a day but typically not after dinner, makes me sleepy. Feels helps anxiety.  Gabapentin helps with anxiety and pain.  Ritalin for focus and improved energy.   Ambien at bedtime for sleep. Relates he has weird dreams. Has been on so long can not sleep without it.     Prozac 60 mg a day for depression    Has trouble staying asleep, goes to lounge chair and sleeps again.   Feeling a little more comfortable having company at home, went to a ballgame and did alright.     Worked in [employer] for 22 years.   Does not like to engage with people socially as a rule, especially people he knew.     Planning to have meal with brothers tomorrow. Plan to have company for Christmas.    Objective :  BP (!) 144/81 (Site: Left, Patient Position: Sitting)   Pulse 69   Resp 18   Ht 1.803 m (5\' 11" )   Wt 115 kg (253 lb)   BMI 35.29 kg/m     Denies chest pain, palpitations, dizziness, syncope.     Alert and oriented x4.  Casual dress, calm, well groomed.  No SI /HI/AVH, delusions, or paranoia. Thoughts are logical, coherent, goal directed.  Good eye contact. Speech is normal rate and tone.  Mood is ``ok affect congruent.  No psychomotor agitation or psychomotor retardation, no cogwheel rigidity or abnormal movements.  Gait is normal.  Attention is good.  Concentration and memory good.  No cognitive deficits noted.  Judgment fair, insight fair.  Calculation and abstraction are within normal limits.      PDMP: Reviewed Valium filled 04/26/2022  Ritalin filled 07/04/2022  Gabapentin filled 05/17/2022  Ambien filled 04/26/2022    Data  reviewed:    Current Outpatient Medications   Medication Sig    buPROPion (WELLBUTRIN SR) 150 mg Oral tablet sustained-release 12 hr TAKE 2 TABLETS EVERY MORNING AND 1 TABLET EVERY 2 P.M. AS DIRECTED    Cholecalciferol, Vitamin D3, 25 mcg (1,000 unit) Oral Capsule Take 1 Capsule (1,000 Units total) by mouth Once a day    diazePAM (VALIUM) 5 mg Oral Tablet Take 1 Tablet (5 mg total) by mouth Three times a day as needed for Anxiety    diclofenac sodium (VOLTAREN) 1 % Gel Apply 2 g topically Four times a day    fenofibrate (LOFIBRA) 160 mg Oral Tablet Take 1 Tablet (160 mg total) by mouth Once a day    FLUoxetine (PROZAC) 20 mg Oral Capsule Take 3 Capsules (60 mg total) by mouth Once a day    gabapentin (NEURONTIN) 400 mg Oral Capsule Take 1 Capsule (400 mg total) by mouth Three times a day    hydrocortisone 2.5 % Apply externally cream with perineal applicator Insert 1 Applicator into the rectum Twice per day as needed    Ibuprofen (MOTRIN) 800 mg Oral Tablet Take 1 Tablet (800 mg total) by mouth Three times a day    methylphenidate HCl (RITALIN) 10 mg Oral Tablet Take 1 Tablet (10 mg  total) by mouth Three times a day    pravastatin (PRAVACHOL) 40 mg Oral Tablet Take 1 Tablet (40 mg total) by mouth Once a day    zolpidem (AMBIEN) 10 mg Oral Tablet Take 1 Tablet (10 mg total) by mouth Every night as needed for Insomnia     Assessment/Plan  Problem List Items Addressed This Visit          Psychiatric    Generalized anxiety disorder     Other Visit Diagnoses       Major depressive disorder, recurrent, moderate (CMS HCC)    -  Primary    Medication management        Relevant Orders    HELP LAB ORDER    Social anxiety disorder        Sleep disturbance              Moderate level of medical decision making, discussion of multiple diagnosis and symptoms, review of PHQ-9, review of symptoms, patient education, discussion of prescribed medications and potential side effects, discussion of psychosocial stressors, and review of  prescription monitoring program information.   Continue current medications.   Consider taking Ambien 1/2 tab with valium 1/2 tab for sleep.   Continue gabapentin 400 mg 3 times a day for pain and anxiety  Continue Methlphendidate 10 mg TID.   Follow up 3 months.     Candie Mile, PMHNP-BC  08/03/2022 14:01

## 2022-08-03 NOTE — Patient Instructions (Signed)
Continue current medications.   Consider taking Ambien 1/2 tab with valium 1/2 tab for sleep.   Continue gabapentin 400 mg 3 times a day for pain and anxiety  Continue Methlphendidate 10 mg TID.   Follow up 3 months.     Aware to avoid alcohol.

## 2022-08-08 NOTE — Addendum Note (Signed)
Addended by: Clide Deutscher A on: 08/08/2022 08:11 AM     Modules accepted: Orders

## 2022-08-11 LAB — DRUG MONITORING, PANEL 5, W/CONFIRM, D/L ISOMERS,URINE
Alphahydroxyalprazolam: NEGATIVE ng/mL (ref <25)
Alphahydroxymidazolam: NEGATIVE ng/mL (ref <50)
Alphahydroxytriazolam: NEGATIVE ng/mL (ref <50)
Aminoclonazepam: NEGATIVE ng/mL (ref <25)
Amphetamines: NEGATIVE ng/mL (ref <500)
Barbiturates: NEGATIVE ng/mL (ref <300)
Benzodiazepines: POSITIVE ng/mL — AB (ref <100)
Cocaine Metabolite: NEGATIVE ng/mL (ref <100)
Creatinine: 65.5 mg/dL (ref >=20.0)
Hydroxyethylflurazepam: NEGATIVE ng/mL (ref <50)
Lorazepam: NEGATIVE ng/mL (ref <50)
Marijuana Metabolite: NEGATIVE ng/mL (ref <20)
Methadone Metabolite: NEGATIVE ng/mL (ref <100)
Nordiazepam: 220 ng/mL — ABNORMAL HIGH (ref <50)
Opiates: NEGATIVE ng/mL (ref <100)
Oxazepam: 490 ng/mL — ABNORMAL HIGH (ref <50)
Oxidant: NEGATIVE ug/mL (ref <200)
Oxycodone: NEGATIVE ng/mL (ref <100)
Temazepam: 530 ng/mL — ABNORMAL HIGH (ref <50)
pH: 6.7 (ref 4.5–9.0)

## 2022-08-11 LAB — NOTES AND COMMENTS

## 2022-08-24 ENCOUNTER — Other Ambulatory Visit (HOSPITAL_PSYCHIATRIC): Payer: Self-pay | Admitting: Family

## 2022-08-24 MED ORDER — METHYLPHENIDATE 10 MG TABLET
10.0000 mg | ORAL_TABLET | Freq: Three times a day (TID) | ORAL | 0 refills | Status: DC
Start: 2022-08-30 — End: 2022-08-29

## 2022-08-24 NOTE — Telephone Encounter (Signed)
[facility] Sent with appropriate fill date.

## 2022-08-29 ENCOUNTER — Other Ambulatory Visit (HOSPITAL_COMMUNITY): Payer: Self-pay | Admitting: Family

## 2022-08-29 MED ORDER — METHYLPHENIDATE 10 MG TABLET
10.0000 mg | ORAL_TABLET | Freq: Three times a day (TID) | ORAL | 0 refills | Status: DC
Start: 2022-08-30 — End: 2022-09-29

## 2022-08-29 NOTE — Telephone Encounter (Signed)
Pt cancelled RX in [place] due to need to travel due to family emergency.

## 2022-09-29 ENCOUNTER — Other Ambulatory Visit (HOSPITAL_PSYCHIATRIC): Payer: Self-pay | Admitting: Family

## 2022-09-29 MED ORDER — METHYLPHENIDATE 10 MG TABLET
10.0000 mg | ORAL_TABLET | Freq: Three times a day (TID) | ORAL | 0 refills | Status: DC
Start: 2022-09-29 — End: 2022-10-26

## 2022-09-29 NOTE — Telephone Encounter (Signed)
[facility]

## 2022-10-26 ENCOUNTER — Other Ambulatory Visit (HOSPITAL_PSYCHIATRIC): Payer: Self-pay | Admitting: Family

## 2022-10-26 MED ORDER — METHYLPHENIDATE 10 MG TABLET
10.0000 mg | ORAL_TABLET | Freq: Three times a day (TID) | ORAL | 0 refills | Status: DC
Start: 2022-10-26 — End: 2022-11-28

## 2022-10-26 NOTE — Telephone Encounter (Signed)
Pt request refill [facility]ital[facility] to [facility] [place].

## 2022-11-03 ENCOUNTER — Ambulatory Visit: Payer: BC Managed Care – PPO | Admitting: Family

## 2022-11-28 ENCOUNTER — Other Ambulatory Visit (HOSPITAL_PSYCHIATRIC): Payer: Self-pay | Admitting: Family

## 2022-11-28 MED ORDER — METHYLPHENIDATE 10 MG TABLET
10.0000 mg | ORAL_TABLET | Freq: Three times a day (TID) | ORAL | 0 refills | Status: DC
Start: 2022-11-28 — End: 2022-12-23

## 2022-11-28 NOTE — Telephone Encounter (Signed)
Pt contacted provider requesting refill [facility]ital[facility] [place].

## 2022-12-19 ENCOUNTER — Ambulatory Visit (HOSPITAL_PSYCHIATRIC): Payer: Self-pay | Admitting: Family

## 2022-12-23 ENCOUNTER — Other Ambulatory Visit (HOSPITAL_PSYCHIATRIC): Payer: Self-pay | Admitting: Family

## 2022-12-23 MED ORDER — METHYLPHENIDATE 10 MG TABLET
10.0000 mg | ORAL_TABLET | Freq: Three times a day (TID) | ORAL | 0 refills | Status: DC
Start: 2022-12-23 — End: 2023-01-24

## 2022-12-23 NOTE — Telephone Encounter (Signed)
Pt requesting refill [facility]italin to [facility] [place]. Has pending appt Dr. Diona Browner.

## 2023-01-24 ENCOUNTER — Telehealth (HOSPITAL_COMMUNITY): Payer: Self-pay | Admitting: Family

## 2023-01-24 MED ORDER — METHYLPHENIDATE 10 MG TABLET
10.0000 mg | ORAL_TABLET | Freq: Three times a day (TID) | ORAL | 0 refills | Status: AC
Start: 2023-01-24 — End: ?

## 2023-01-24 NOTE — Telephone Encounter (Signed)
Patient request refill Ritalin, Has followup with Dr. Diona Browner on 02/11/2023. Patient located in [facility]. Reports no changes in his mental health.

## 2023-01-25 ENCOUNTER — Other Ambulatory Visit (HOSPITAL_PSYCHIATRIC): Payer: Self-pay | Admitting: Family

## 2023-01-25 MED ORDER — BUPROPION HCL SR 150 MG TABLET,12 HR SUSTAINED-RELEASE
ORAL_TABLET | ORAL | 0 refills | Status: AC
Start: 2023-01-25 — End: ?

## 2023-01-25 NOTE — Telephone Encounter (Signed)
[facility] requesting a 90 day prescription for bupropion. If you don't care to approve or deny. Next apt is 5/24. Thank you

## 2023-01-25 NOTE — Telephone Encounter (Signed)
RX approved and encounter closed

## 2023-02-03 ENCOUNTER — Ambulatory Visit: Payer: BC Managed Care – PPO | Admitting: Family

## 2023-02-06 ENCOUNTER — Other Ambulatory Visit (HOSPITAL_COMMUNITY): Payer: Self-pay | Admitting: Family

## 2023-02-06 MED ORDER — GABAPENTIN 400 MG CAPSULE
400.0000 mg | ORAL_CAPSULE | Freq: Three times a day (TID) | ORAL | 0 refills | Status: AC
Start: 2023-02-06 — End: ?

## 2023-02-06 NOTE — Telephone Encounter (Signed)
Pt request refill Neurontin. Has pending appointment with Dr. Diona Browner, long term established mental health provider.   Candie Mile, PMHNP-BC  02/06/2023 13:17

## 2023-05-16 ENCOUNTER — Other Ambulatory Visit (HOSPITAL_PSYCHIATRIC): Payer: Self-pay | Admitting: Family

## 2023-05-16 NOTE — Telephone Encounter (Signed)
Patient not seen by provider

## 2023-06-07 IMAGING — MR MRI KNEE RT W/O CONTRAST
5 series · 35 of 40 positions shown · IV contrast (gadolinium)
Comparison: None available.

﻿EXAM:  07081   MRI KNEE RT W/O CONTRAST
INDICATION: Patient felt popping sensation upon bending, last Monday. Persistent pain in the right knee over the medial aspect.  No history of previous surgery.
TECHNIQUE: Multiplanar, multisequential MRI of the right knee was performed without gadolinium contrast.

[Series 5: PD fat-sat · axial · right · 5.0mm · 0.33mm/px · z∈[-133,+4]mm · 8 of 26 slices shown (1 of 3)]
[im 1/26]
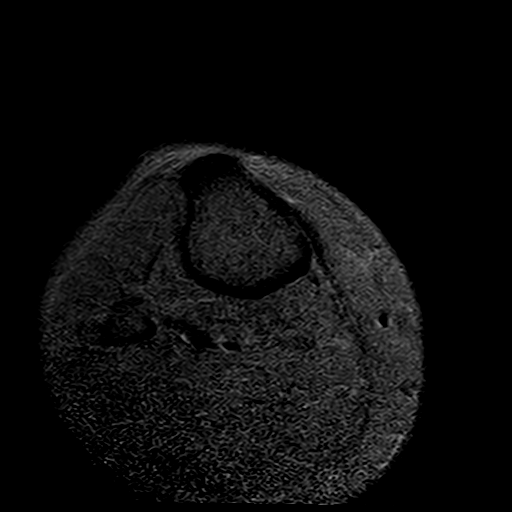
[im 4/26]
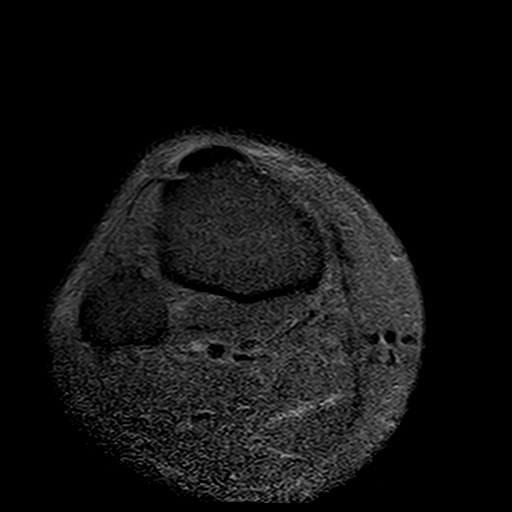
[im 8/26]
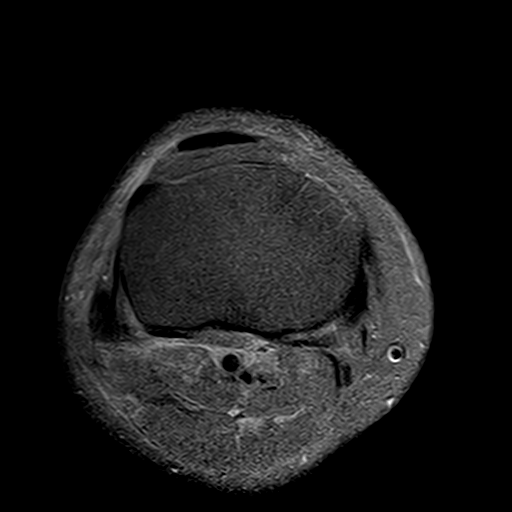
[im 11/26]
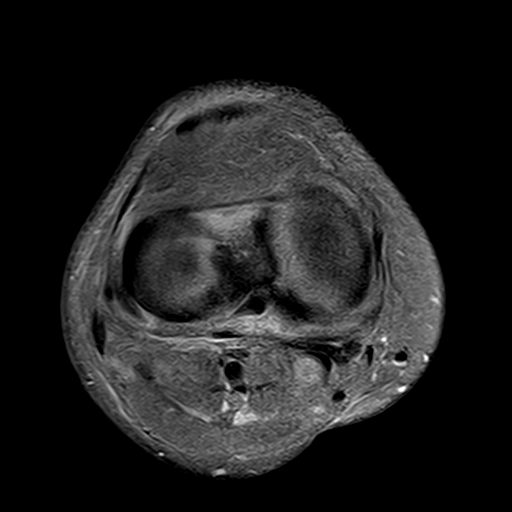
[im 15/26]
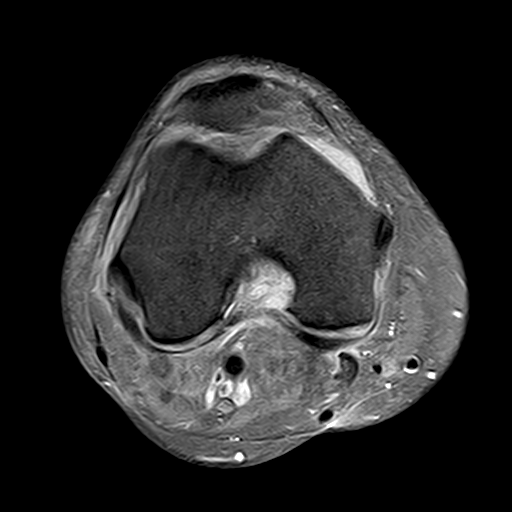
[im 18/26]
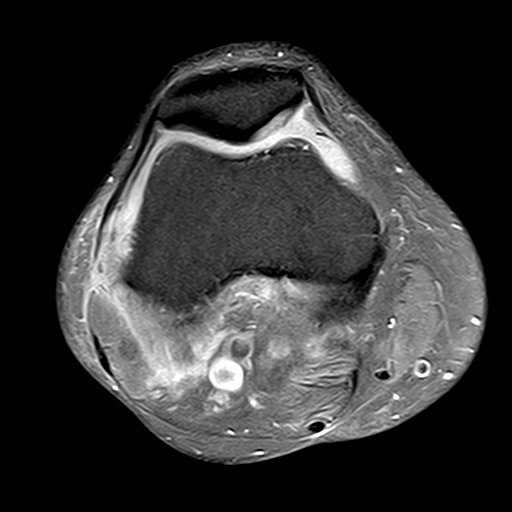
[im 22/26]
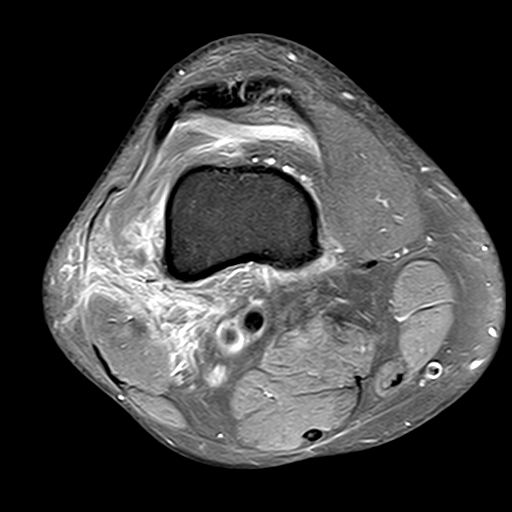
[im 26/26]
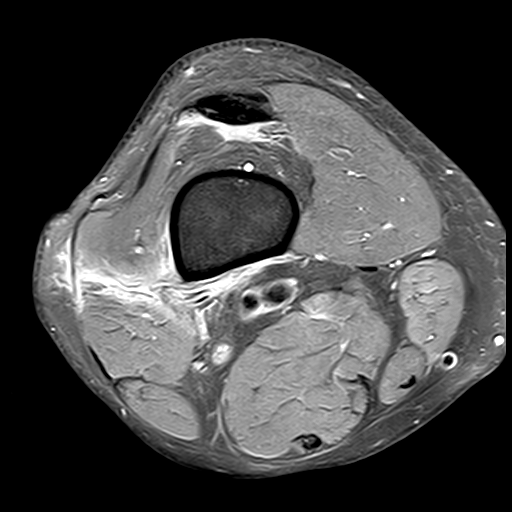

[Series 6: PD fat-sat · sagittal · right · 4.0mm · 0.53mm/px · 8 of 29 slices shown (2 of 3)]
[im 1/29]
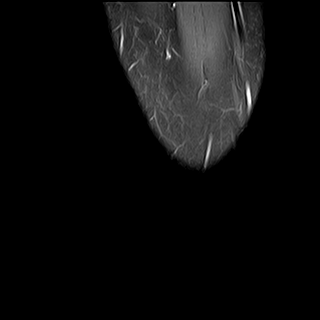
[im 5/29]
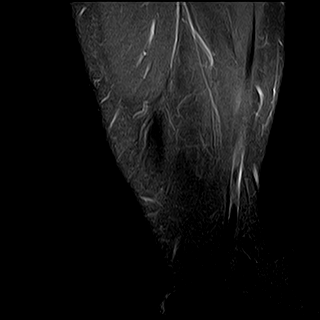
[im 9/29]
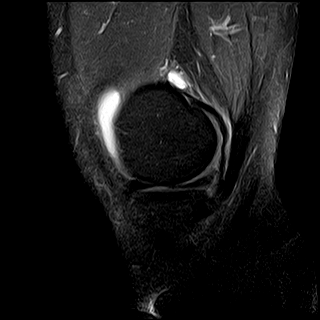
[im 13/29]
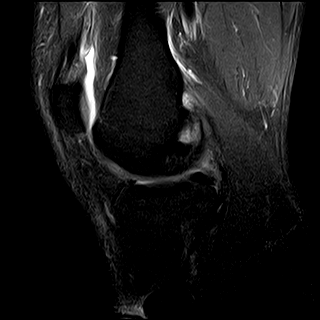
[im 17/29]
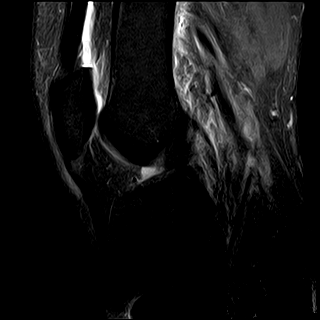
[im 21/29]
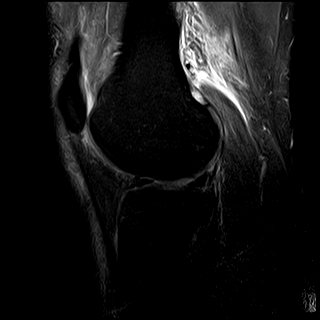
[im 25/29]
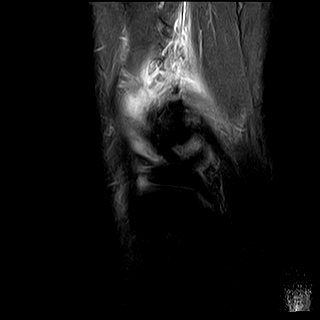
[im 29/29]
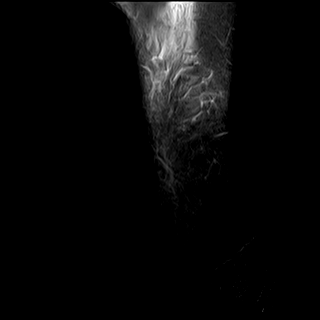

[Series 7: T1 · sagittal · right · 4.0mm · 0.53mm/px · 8 of 29 slices shown]
[im 1/29]
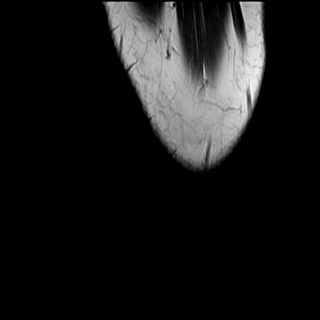
[im 5/29]
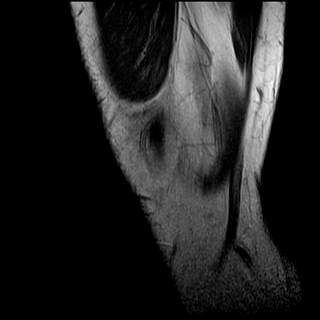
[im 9/29]
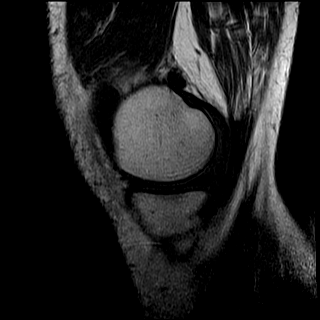
[im 13/29]
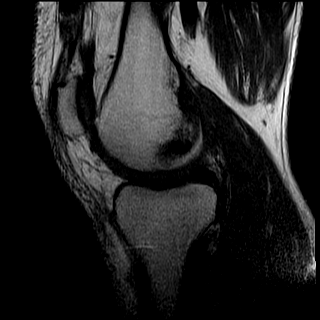
[im 17/29]
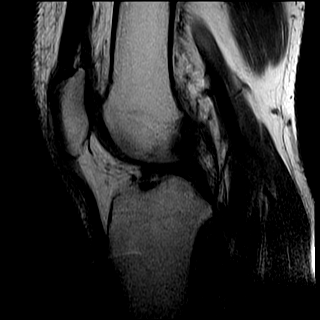
[im 21/29]
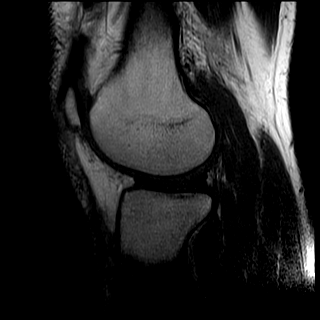
[im 25/29]
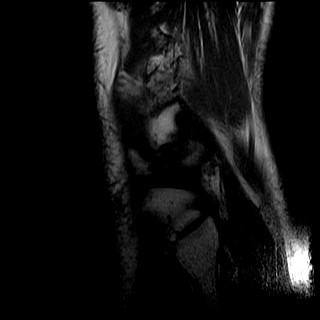
[im 29/29]
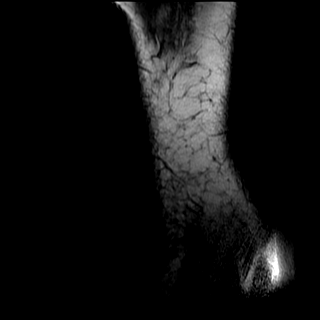

[Series 8: STIR · coronal · right · 4.5mm · 0.59mm/px · 3 of 30 slices shown]
[im 1/30]
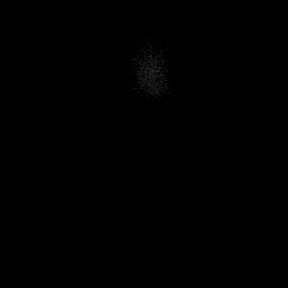
[im 5/30]
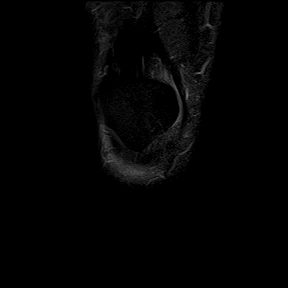
[im 9/30]
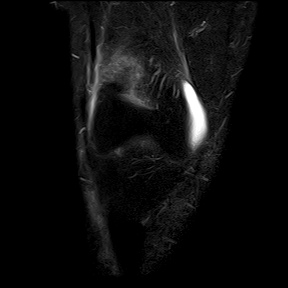

[Series 9: PD fat-sat · coronal · right · 4.5mm · 0.53mm/px · 8 of 30 slices shown (3 of 3)]
[im 1/30]
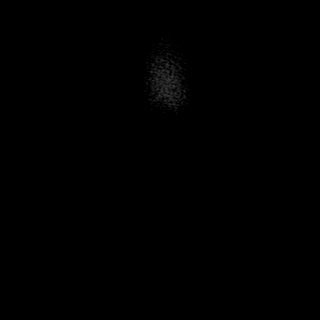
[im 5/30]
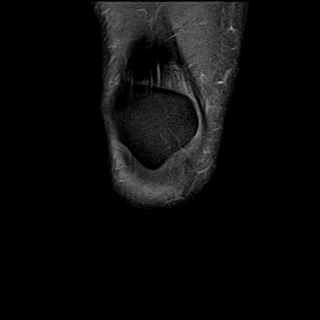
[im 9/30]
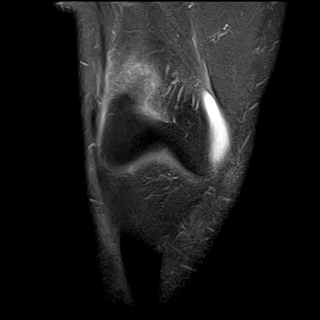
[im 13/30]
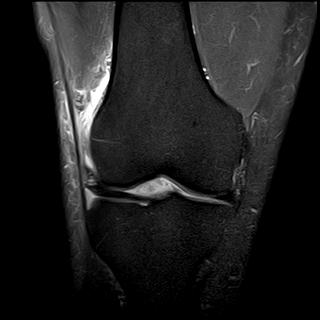
[im 17/30]
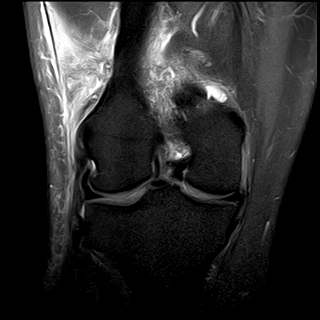
[im 21/30]
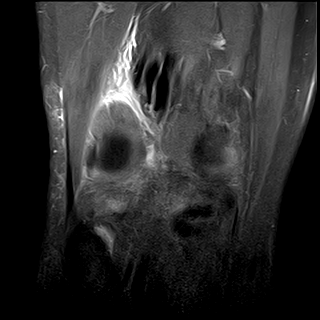
[im 25/30]
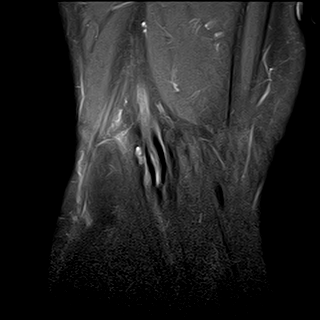
[im 30/30]
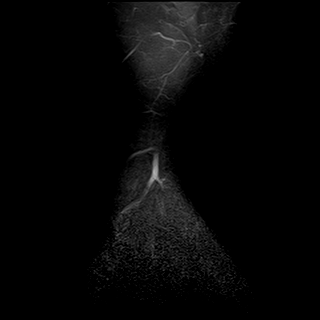

[35 of 40 positions shown; findings below may reference images not displayed]

FINDINGS: No acute fracture or bone bruise is noted at the right knee.

However, large area of bruising of the soft tissues and muscle at the proximal femoral attachment of lateral head of the gastrocnemius is noted in the posterior lateral aspect of lower thigh and right knee.  This is suggestive of probable partial-thickness tear of the proximal origin of the lateral head of the gastrocnemius.

No acute abnormalities of the medial and lateral meniscus are seen.  Anterior and posterior cruciate ligaments are intact.  Collateral ligaments are intact. Quadriceps tendon and patellar tendon are intact.  Small effusion is noted in the knee joint.
IMPRESSION: 1. No acute fracture or bone bruise at the right knee.

2. Large area of soft tissue bruising of the posterior lateral aspect of the distal right thigh and at the right knee involving the area surrounding femoral attachment of lateral head of the gastrocnemius suggestive of partial-thickness disruption with surrounding muscular bruising.

3. Medial and lateral meniscus, cruciate ligaments and collateral ligaments are intact.

4. Small effusion in the knee joint.

## 2023-09-04 IMAGING — MR MRI KNEE LT W/O CONTRAST
5 series · 34 of 40 positions shown · IV contrast (gadolinium)
Comparison: None previous.

﻿EXAM:  [id number]   MRI KNEE LT W/O CONTRAST
INDICATION: Persistent medial left knee pain. Diminished range of motion.  Patient sustained twisting injury last week.  Prior history of ACL and meniscus surgery.
TECHNIQUE: Multiplanar, multisequential MRI of the left knee was performed without gadolinium contrast.

[Series 5: PD fat-sat · axial · left · 5.5mm · 0.37mm/px · z∈[-184,-34]mm · 8 of 26 slices shown (1 of 3)]
[im 1/26]
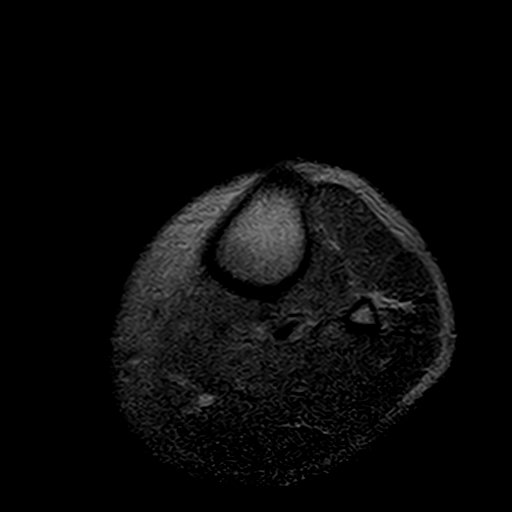
[im 4/26]
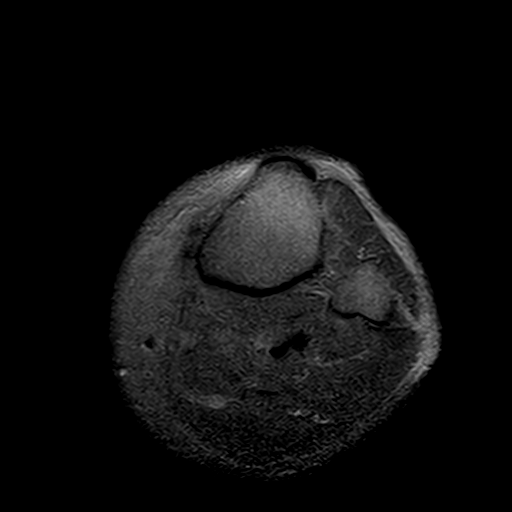
[im 8/26]
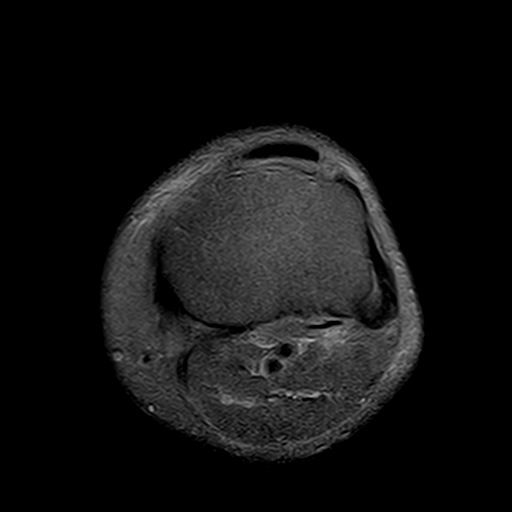
[im 11/26]
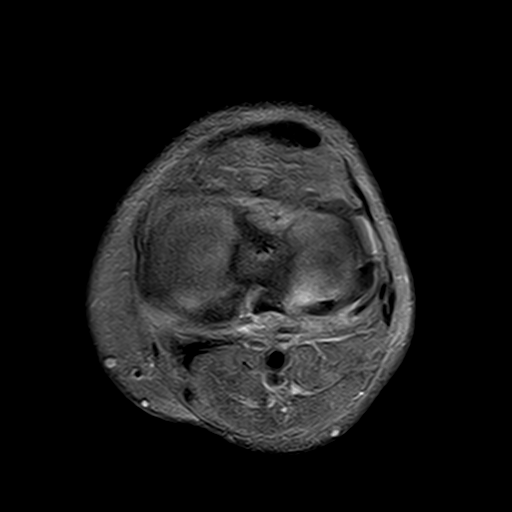
[im 15/26]
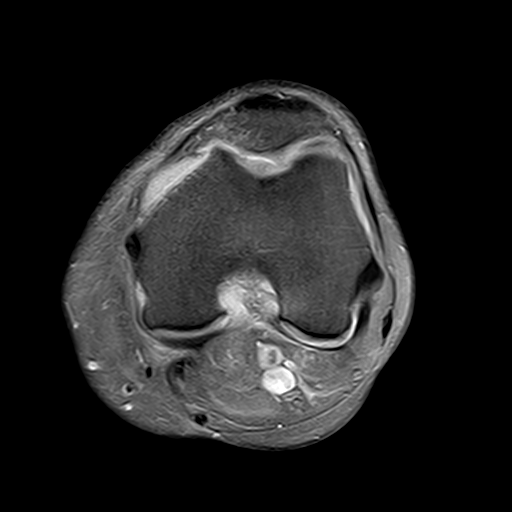
[im 18/26]
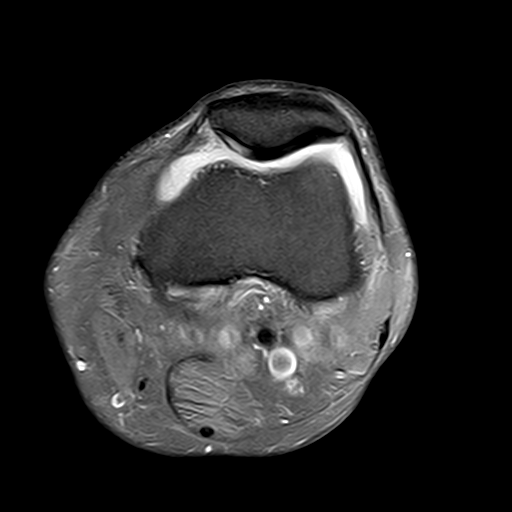
[im 22/26]
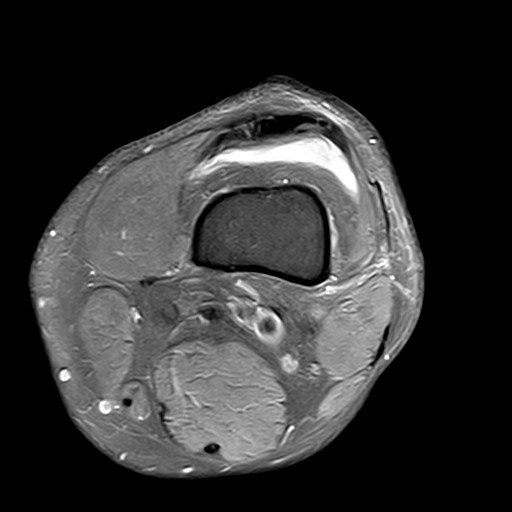
[im 26/26]
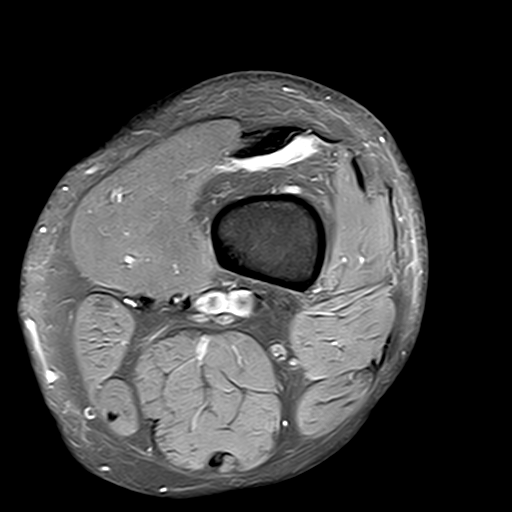

[Series 6: PD fat-sat · sagittal · left · 5.0mm · 0.59mm/px · 8 of 28 slices shown (2 of 3)]
[im 1/28]
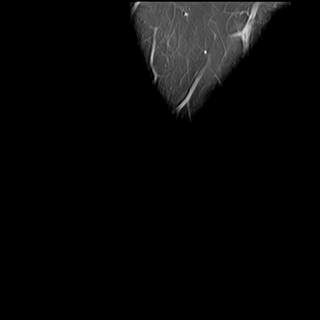
[im 4/28]
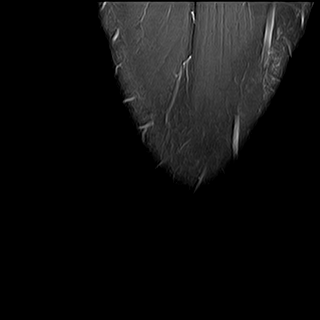
[im 8/28]
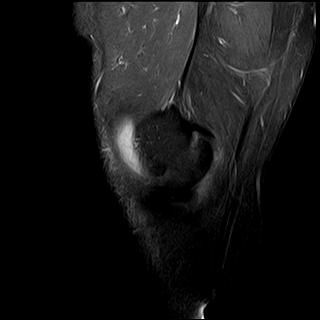
[im 12/28]
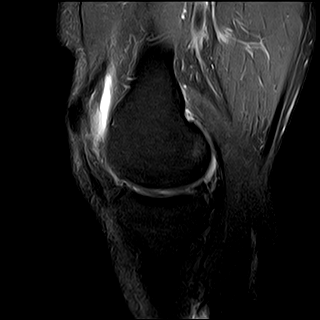
[im 16/28]
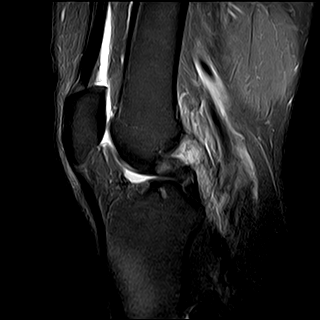
[im 20/28]
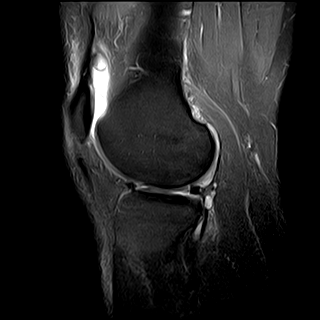
[im 24/28]
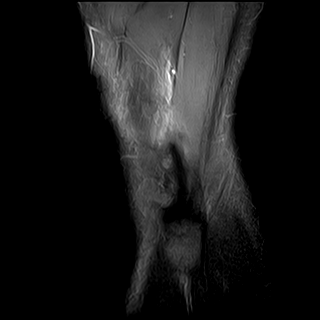
[im 28/28]
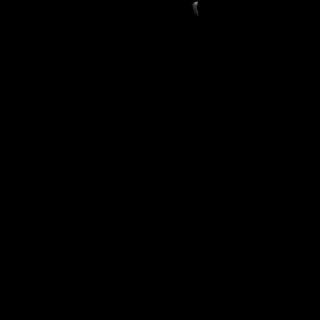

[Series 7: T1 · sagittal · left · 5.0mm · 0.59mm/px · 8 of 28 slices shown]
[im 1/28]
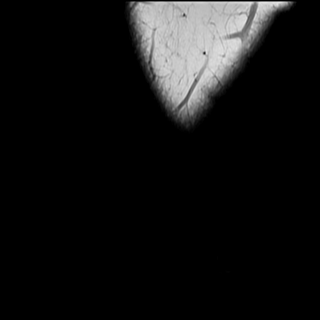
[im 4/28]
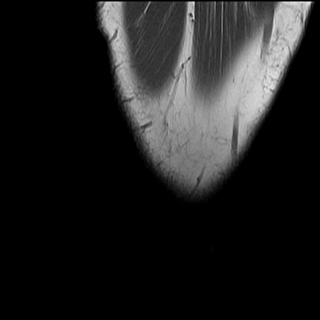
[im 8/28]
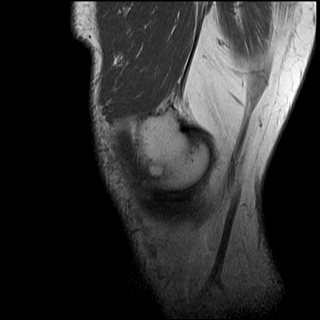
[im 12/28]
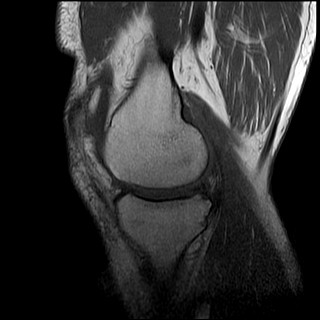
[im 16/28]
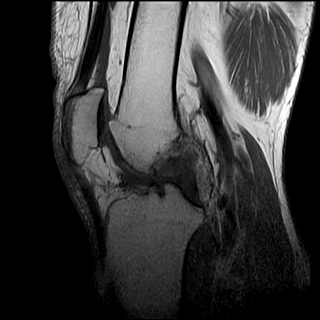
[im 20/28]
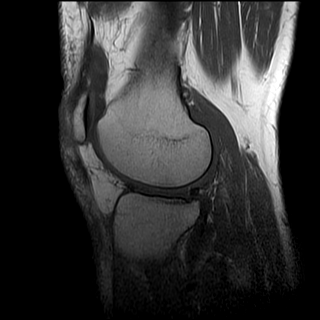
[im 24/28]
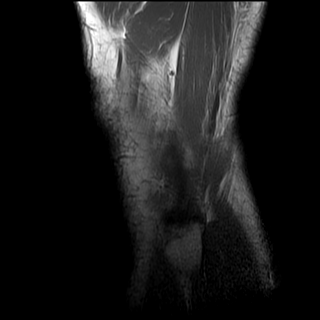
[im 28/28]
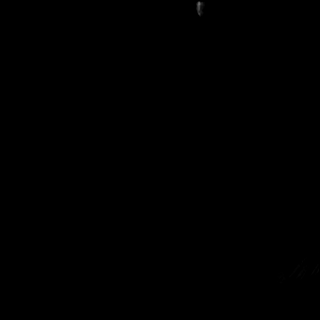

[Series 8: STIR · coronal · left · 4.5mm · 0.66mm/px · 2 of 30 slices shown]
[im 1/30]
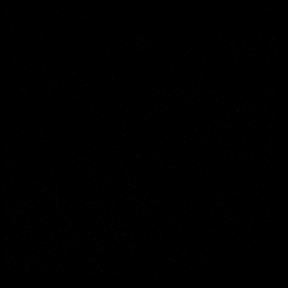
[im 5/30]
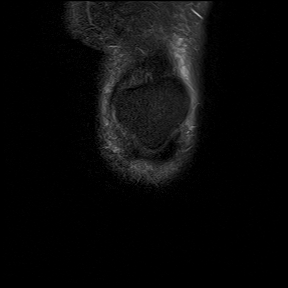

[Series 9: PD fat-sat · coronal · left · 4.5mm · 0.59mm/px · 8 of 30 slices shown (3 of 3)]
[im 1/30]
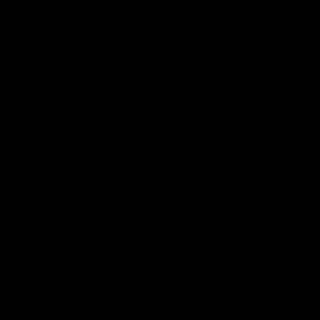
[im 5/30]
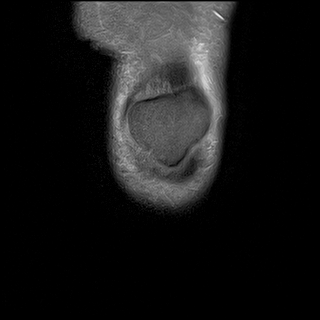
[im 9/30]
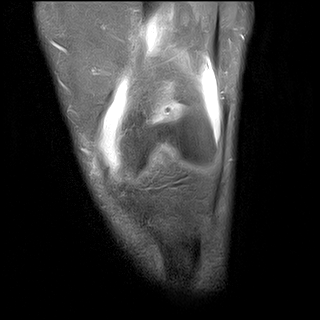
[im 13/30]
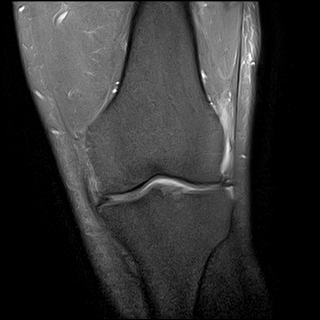
[im 17/30]
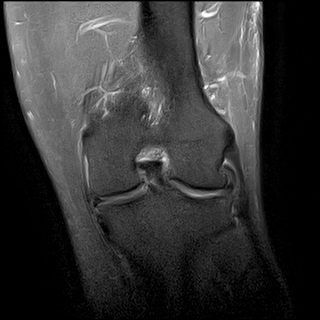
[im 21/30]
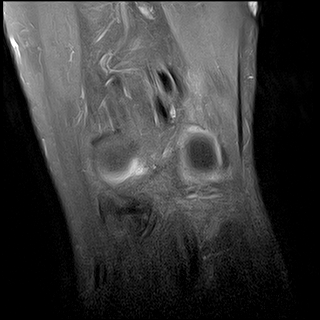
[im 25/30]
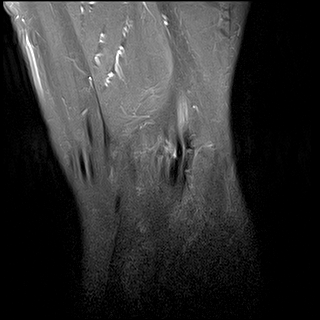
[im 30/30]
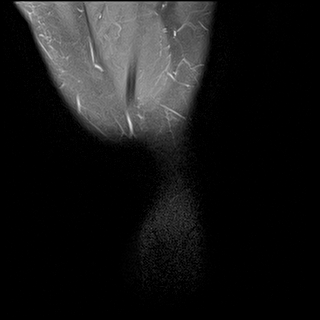

[34 of 40 positions shown; findings below may reference images not displayed]

FINDINGS: No acute bony lesions are seen.  Deformity of the lateral meniscus shows partial meniscectomy predominantly anterior and midportion of lateral meniscus.  Grade 3 degenerative changes of lateral articular cartilage. 

Posterior cruciate ligament is intact. Anterior cruciate ligament is not visualized in the sagittal and coronal projections suggestive of chronic tear.

Deformity of the medial meniscus is noted with evidence suggestive of a displaced bucket-handle tear of the medial meniscus with displaced fragment in the intercondylar aspect of the knee joint.  Grade 3 degenerative changes of medial articular cartilage.  Collateral ligaments are intact. Quadriceps and patellar tendon are intact.  Small effusion in the knee joint.
IMPRESSION: 1. Suboptimal examination due to motion artifacts on some of the sequences.

2. Evidence suggestive of a displaced bucket-handle tear of the medial meniscus is noted as described above.

3. Postoperative changes of lateral meniscus and degenerative changes of medial and lateral articular cartilages are noted.  Effusion in the knee joint.

4. Nonvisualization of anterior cruciate ligament in the coronal and sagittal images suggestive of chronically torn ACL.
